# Patient Record
Sex: Female | Born: 1935 | Race: White | Hispanic: No
Health system: Southern US, Community
[De-identification: ages and names within clinical notes are randomized; demographics above are authoritative.]

---

## 1998-02-03 ENCOUNTER — Ambulatory Visit (HOSPITAL_COMMUNITY): Admission: RE | Admit: 1998-02-03 | Discharge: 1998-02-03 | Payer: Self-pay | Admitting: Family Medicine

## 1999-08-24 ENCOUNTER — Ambulatory Visit (HOSPITAL_COMMUNITY): Admission: RE | Admit: 1999-08-24 | Discharge: 1999-08-24 | Payer: Self-pay | Admitting: Family Medicine

## 2000-08-29 ENCOUNTER — Ambulatory Visit (HOSPITAL_COMMUNITY): Admission: RE | Admit: 2000-08-29 | Discharge: 2000-08-29 | Payer: Self-pay | Admitting: Family Medicine

## 2000-08-29 ENCOUNTER — Encounter: Payer: Self-pay | Admitting: Family Medicine

## 2002-10-31 ENCOUNTER — Ambulatory Visit (HOSPITAL_COMMUNITY): Admission: RE | Admit: 2002-10-31 | Discharge: 2002-10-31 | Payer: Self-pay | Admitting: *Deleted

## 2004-09-16 ENCOUNTER — Inpatient Hospital Stay: Payer: Self-pay | Admitting: Internal Medicine

## 2004-10-07 ENCOUNTER — Ambulatory Visit: Payer: Self-pay | Admitting: Internal Medicine

## 2004-10-15 ENCOUNTER — Ambulatory Visit: Payer: Self-pay

## 2005-01-12 ENCOUNTER — Ambulatory Visit: Payer: Self-pay | Admitting: Internal Medicine

## 2005-07-21 ENCOUNTER — Ambulatory Visit: Payer: Self-pay | Admitting: Internal Medicine

## 2006-01-19 ENCOUNTER — Ambulatory Visit: Payer: Self-pay | Admitting: Gastroenterology

## 2006-03-02 ENCOUNTER — Ambulatory Visit: Payer: Self-pay | Admitting: Gastroenterology

## 2006-11-04 ENCOUNTER — Ambulatory Visit: Payer: Self-pay | Admitting: Internal Medicine

## 2006-12-12 ENCOUNTER — Ambulatory Visit: Payer: Self-pay | Admitting: Vascular Surgery

## 2006-12-15 ENCOUNTER — Inpatient Hospital Stay: Payer: Self-pay | Admitting: Vascular Surgery

## 2007-03-07 ENCOUNTER — Ambulatory Visit: Payer: Self-pay | Admitting: Internal Medicine

## 2008-05-02 ENCOUNTER — Ambulatory Visit: Payer: Self-pay | Admitting: Gastroenterology

## 2008-05-29 ENCOUNTER — Ambulatory Visit: Payer: Self-pay | Admitting: Gastroenterology

## 2010-06-16 ENCOUNTER — Inpatient Hospital Stay: Payer: Self-pay | Admitting: Internal Medicine

## 2010-07-03 ENCOUNTER — Encounter: Payer: Self-pay | Admitting: Internal Medicine

## 2010-07-14 ENCOUNTER — Encounter: Payer: Self-pay | Admitting: Internal Medicine

## 2010-08-13 ENCOUNTER — Encounter: Payer: Self-pay | Admitting: Internal Medicine

## 2010-08-18 ENCOUNTER — Encounter: Payer: Self-pay | Admitting: Cardiology

## 2010-09-13 ENCOUNTER — Encounter: Payer: Self-pay | Admitting: Cardiology

## 2010-10-14 ENCOUNTER — Encounter: Payer: Self-pay | Admitting: Cardiology

## 2010-10-26 ENCOUNTER — Ambulatory Visit: Payer: Self-pay | Admitting: Internal Medicine

## 2010-11-12 ENCOUNTER — Ambulatory Visit: Payer: Self-pay | Admitting: Internal Medicine

## 2010-12-13 ENCOUNTER — Ambulatory Visit: Payer: Self-pay | Admitting: Internal Medicine

## 2011-01-01 ENCOUNTER — Ambulatory Visit: Payer: Self-pay

## 2012-04-28 ENCOUNTER — Inpatient Hospital Stay: Payer: Self-pay | Admitting: Specialist

## 2012-04-28 ENCOUNTER — Ambulatory Visit: Payer: Self-pay | Admitting: Unknown Physician Specialty

## 2013-05-28 ENCOUNTER — Ambulatory Visit: Payer: Self-pay | Admitting: Internal Medicine

## 2014-07-05 ENCOUNTER — Inpatient Hospital Stay: Payer: Self-pay | Admitting: Internal Medicine

## 2014-07-18 ENCOUNTER — Inpatient Hospital Stay: Payer: Self-pay | Admitting: Internal Medicine

## 2014-08-06 ENCOUNTER — Inpatient Hospital Stay: Payer: Self-pay | Admitting: Internal Medicine

## 2014-08-12 ENCOUNTER — Encounter: Payer: Self-pay | Admitting: Internal Medicine

## 2014-08-13 ENCOUNTER — Encounter: Payer: Self-pay | Admitting: Internal Medicine

## 2014-08-27 ENCOUNTER — Emergency Department: Payer: Self-pay | Admitting: Emergency Medicine

## 2014-09-13 ENCOUNTER — Encounter: Payer: Self-pay | Admitting: Internal Medicine

## 2014-10-21 ENCOUNTER — Ambulatory Visit: Payer: Self-pay | Admitting: Podiatry

## 2014-12-13 ENCOUNTER — Ambulatory Visit
Admit: 2014-12-13 | Disposition: A | Discharge status: Home / Self Care | Payer: Self-pay | Attending: Internal Medicine | Admitting: Internal Medicine

## 2014-12-23 ENCOUNTER — Inpatient Hospital Stay: Admit: 2014-12-23 | Disposition: E | Payer: Self-pay | Attending: Internal Medicine | Admitting: Internal Medicine

## 2014-12-31 NOTE — Consult Note (Signed)
PATIENT NAME:  Margaret Booth, PERREN MR#:  [id number] DATE OF BIRTH:  [birth year]  DATE OF CONSULTATION:  04/29/2012  REFERRING PHYSICIAN:  Ruthann Cancer, MD CONSULTING PHYSICIAN:  Starleen Arms, MD  PRIMARY CARE PHYSICIAN:  Einar Crow, MD  REASON FOR CONSULT:  Medical management and preoperative evaluation for left wrist surgical fracture repair.   HISTORY OF PRESENT ILLNESS: This is a 81 year old female who lives at home who had a mechanical fall as she tripped while playing with her dog and fell backward on her left wrist. She presented with laceration and was found to have open fracture of the left distal radius and ulna. She was admitted to the orthopedic service, Dr. Golda Acre service, who requested medical consult for medical management and preop evaluation. The patient denies any chest pain, shortness of breath, palpitations, altered mental status, loss of consciousness, or presyncope or syncope prior to fall and reported having tripped on her dog.  The patient's initial blood work in the ED showed her creatinine of 1.42, which is at her baseline, and she had mild leukocytosis at 12.1. The patient has a significant cardiac history where she had coronary artery disease and critical aortic valve stenosis where she had recent CABG and St. Jude Trifecta valve replacement at [facility] in October 2011.   PAST MEDICAL HISTORY:  1. History of coronary artery disease status post CABG with LIMA and distal LAD and aortic valve replacement with St. Jude Trifecta valve in October 2011.  2. Hypertension.  3. Hyperlipidemia.  4. History of transient ischemic attack in 2004. 5. Status post carotid endarterectomy.  6. History of chronic obstructive pulmonary disease.  7. History of diabetes mellitus.  8. History of congestive heart failure.   PAST SURGICAL HISTORY:  1. Status post tonsillectomy and adenoidectomy.  2. Status post appendectomy.  3. Status post bilateral tubal ligation.  4. Status post  resection of Morton's neuroma.  5. Status post hysterectomy.  6. Status post lumbar laminectomy.  7. Status post CABG and aortic valve replacement in 2011.   HOME MEDICATIONS:  1. Entocort 3 mg oral daily.  2. Vitamin D3 1000 units daily.  3. Simvastatin 80 mg at bedtime.  4. Glipizide 5 mg daily.  5. Lisinopril 20 mg daily.  6. Toprol-XL 25 mg daily.   ALLERGIES: No known drug allergies.   FAMILY HISTORY: Positive for coronary artery disease and brain tumor in family. Negative for breast, colon, or ovarian cancer.   SOCIAL HISTORY: The patient is a former smoker, 30+ pack-years.   REVIEW OF SYSTEMS: CONSTITUTIONAL: Denies any fever, fatigue, or weakness. EYES: Denies blurry vision, double vision, or pain. ENT: Denies tinnitus, ear pain, or hearing loss.  RESPIRATORY: Denies cough, wheezing, hemoptysis, or dyspnea.  Using oxygen at nighttime.  CARDIOVASCULAR: Denies chest pain, edema, arrhythmia, palpitations, syncope, or near syncope. GASTROINTESTINAL: Denies nausea, vomiting, diarrhea, or abdominal pain. GENITOURINARY: Denies dysuria, hematuria, or renal colic. ENDO: Denies polyuria, polydipsia, or heat or cold intolerance. HEMATOLOGIC: Denies anemia, easy bruising, or bleeding diathesis. INTEGUMENT: Denies any acne or rash. MUSCULOSKELETAL:  She had a fall with left wrist fracture. NEURO: Denies any numbness, epilepsy, tremors, or vertigo. PSYCH: Denies any insomnia, schizophrenia, or nervousness.   PHYSICAL EXAMINATION:  VITAL SIGNS: Temperature 96.3, pulse 66, respiratory rate 18, last blood pressure 228/88, saturating at 100% on room air.   GENERAL: Morbidly obese female, looks comfortable, in no apparent distress.   HEENT: Head atraumatic, normocephalic. Pupils are equal and reactive to light. Pink conjunctivae. Anicteric  sclerae. Moist oral mucosa.   NECK: Supple. No thyromegaly. No JVD.   CHEST: Good air entry bilaterally. No wheezing, rales, or rhonchi.   CARDIOVASCULAR:  S1, S2 heard. No rubs or gallops. Had a grade 1 systolic murmur.   ABDOMEN: Obese, soft, nontender, nondistended. Bowel sounds present.   EXTREMITIES: Lower extremities no edema, no clubbing, no cyanosis.  Has left upper extremity wrapped around the wrist area.   PSYCHIATRIC: Appropriate affect. Awake, alert, oriented times three. Intact judgment and insight.   NEURO:  Cranial nerves grossly intact. Motor five out of five, decreased left upper extremity secondary to pain.   SKIN: No rash. Normal skin turgor. Warm and dry.   PERTINENT LABORATORY DATA:  Glucose 198, BUN 28, creatinine 1.42, sodium 141, potassium 4.3, chloride 105, CO2 27. White blood cells 12.1, hemoglobin 13.7, hematocrit 40.7,  platelets 178, INR 0.8.   ASSESSMENT AND PLAN:  1. This is a 81 year old female status post mechanical fall who presents with left radius and ulnar fractures, being planned for surgical intervention in the a.m. The patient has no chest pain, shortness of breath, or palpitations, but her blood pressure is uncontrolled most likely due to pain. We will resume the patient on her home hypertension medications Toprol-XL and lisinopril. As well we will add IV p.r.n. hydralazine. The patient was started on IV antibiotics by the orthopedic service.  2. Hypertension, uncontrolled most likely due to pain. We will resume Toprol-XL and lisinopril. We will give first doses now and we will have her on p.r.n. hydralazine.  3. Hyperlipidemia. We will resume statin.  4. Diabetes mellitus. We will hold oral hyperglycemic agent and keep on insulin sliding scale.  5. We will have the patient on sequential compression devices for deep vein thrombosis prophylaxis currently and will leave chemical anticoagulation for primary orthopedic team after surgery.    TOTAL TIME SPENT ON PATIENT CARE: 50 minutes.     ____________________________ Starleen Armsawood S. Chezney Huether, MD dse:bjt D: 04/29/2012 02:25:56 ET T: 04/29/2012 09:31:24  ET JOB#: [id number]  cc: Starleen Armsawood S. Calyb Mcquarrie, MD, <Dictator> Marya AmslerMarshall W. Dareen PianoAnderson, MD Jerrika Ledlow Teena IraniS Nalee Lightle MD ELECTRONICALLY SIGNED 04/30/2012 0:17

## 2014-12-31 NOTE — Op Note (Signed)
PATIENT NAME:  Margaret Booth, Margaret Booth DATE OF BIRTH:  [birth year]  DATE OF PROCEDURE:  04/29/2012  PREOPERATIVE DIAGNOSIS: Grade 1 open Colles' fracture, left wrist.   POSTOPERATIVE DIAGNOSIS:  Grade 1 open Colles' fracture, left wrist.   PROCEDURE:  Open reduction, internal fixation of left distal radius, debridement of  tissue minimal.   SURGEON: Winn JockJames C. Beyounce Dickens, M.D.   ASSISTANT: None.   ANESTHESIA: General.   ESTIMATED BLOOD LOSS: Minimal.   COMPLICATIONS: None.   IMPLANTS USED: [employer] volar plate.   BRIEF CLINICAL NOTE AND PATHOLOGY: The patient fell and suffered the above-mentioned fracture. She had several skin abrasions and one small laceration. She was admitted, worked up medically, and the patient was taken to the operating room. She had a very small area of skin penetration which seemed to be more of an actual skin tear. The patient's bone was very osteoporotic. Her skin was extremely friable.   DESCRIPTION OF PROCEDURE: Preop antibiotics, adequate anesthesia, supine position, Betadine scrub followed by prep. The area of the skin tears was thoroughly evaluated.  The open area was debrided and thoroughly irrigated. This was done with a scalpel and was primarily soft tissue.   The fracture was then exposed through a volar incision, through the flexor carpi radialis sheath. Neurovascular structures were protected, the interval developed down to the radius with the pronator being reflected. The fracture was examined under direct visualization, thoroughly irrigated, and reduced under direct visualization. AP and lateral views showed satisfactory reduction. The plate was then applied in routine fashion. Screws had excellent purchase and reduction was very good. The AP, lateral, and oblique views showed good positioning. Multiple fragments were captured. The area was thoroughly irrigated. Hemostasis was good. The tourniquet was not used. The wound was closed with  0 Vicryl and with nylon. Soft sterile dressing was applied and Xeroform was placed over the areas of skin abrasion. The patient was placed in a short arm splint. She was awakened and taken to the postanesthesia care unit having tolerated the procedure well. Sponge and needle counts were correct prior to and after wound closure.      ____________________________ Winn JockJames C. Gerrit Heckaliff, MD jcc:bjt D: 05/11/2012 11:01:28 ET T: 05/11/2012 11:27:01 ET JOB#: [id number]  cc: Winn JockJames C. Gerrit Heckaliff, MD, <Dictator> Winn JockJAMES C Simon Aaberg MD ELECTRONICALLY SIGNED 05/13/2012 20:22

## 2014-12-31 NOTE — H&P (Signed)
Subjective/Chief Complaint Left wrist injury    History of Present Illness Lost balance and fell backwars, landed on left wrist.Was seen in ER.  Skin tear with duistal radius and ulna fracture. No LOC   Past Med/Surgical Hx:  Hypercholesterolemia:   HTN:   Diabetes:   CABG (Coronary Artery Bypass Graft):   aortic valve replacement:   ALLERGIES:  No Known Allergies:   HOME MEDICATIONS: Medication Instructions Status  Vicodin tablet 500 mg-5 mg 1 tab(s) orally every 6 hours x 5 days PRN   Active  Advil 1 tab(s)   PRN   Active  simvastatin 80 mg oral tablet 1 tab(s) orally once a day (at bedtime)  Active  metoprolol 25 mg oral tablet 1 tab(s) orally once a day (in the morning)  Active  calcium-vitamin D 1 tab(s)  once a day (in the morning)  Active  lisinopril 10 mg oral tablet 1 tab(s) orally once a day (in the morning)  Active  [facility] Aspirin 81 mg oral tablet, chewable 1 tab(s) orally once a day (in the morning)  Active  Entocort EC 3 mg oral capsule, extended release 1  orally 3 times a day  Active  Januvia 50 mg oral tablet 1  orally once a day  Active  glipiZIDE 5 mg oral tablet 1  orally once a day  Active   Family and Social History:   Family History Non-Contributory    Social History negative tobacco, negative ETOH, negative Illicit drugs    Place of Living Home   Review of Systems:   Fever/Chills No    Cough No    Sputum No    Abdominal Pain No    Diarrhea No    Constipation No    Nausea/Vomiting No    SOB/DOE No    Chest Pain No    Dysuria No    Tolerating Diet Yes   Physical Exam:   GEN well developed    HEENT PERRL    NECK supple    RESP clear BS    CARD regular rate    ABD denies tenderness  soft  normal BS    LYMPH negative neck, negative axillae    EXTR Left wrist deformed. Pain with any motion. Several skin tears    SKIN normal to palpation, Except left wrist    NEURO motor/sensory function intact, except left wrist,  difficult to evaluate    PSYCH A+O to time, place, person   Radiology Results: XRay:    16-Aug-13 22:12, Wrist Left Complete   Wrist Left Complete   REASON FOR EXAM:    fall  COMMENTS:   LMP: Post-Menopausal    PROCEDURE: DXR - DXR WRIST LT COMP WITH OBLIQUES  - Apr 28 2012 10:12PM     RESULT: Four views of the left wrist are submitted. The patient has   sustained an acute displaced fracture of the distal left radial   metaphysis. There is a fracture through the ulnar styloid as well. There   is degenerative change of the first carpometacarpal joint. No acute   carpal bone fracture is demonstrated. The metacarpals appear intact.    IMPRESSION:  The patient has sustained an acute displaced fracture of the   distal left radial metaphysis. An ulnar styloid fracture is present as   well.     Dictation Site: 5          Verified By: DAVID A. [place], M.D., MD  CT:    16-Aug-13  22:28, CT Cervical Spine Without Contrast   CT Cervical Spine Without Contrast   REASON FOR EXAM:    fall  COMMENTS:   LMP: Post Hysterectomy    PROCEDURE: CT  - CT CERVICAL SPINE WO  - Apr 28 2012 10:28PM     RESULT: Sagittal, axial, and coronal images through the cervical spine   are reviewed.     The cervical vertebral bodies are preserved in height. The prevertebral   soft tissue spaces appear normal. There is disc space narrowing at the   C5-C6 and C6-C7 disc levels. There is no evidence of a perched facet. The   spinous processes are intact. There is moderate facet joint degenerative   change at multiple levels. The lateral masses of C1 align normally with   those of C2. The odontoid is intact. The bony ring at each cervical level   is intact. The limited amount of the pulmonary apices visible reveals no     pneumothorax.    IMPRESSION:   1. There is no evidence of an acute cervical spine fracture nor   dislocation.  2. There is mild degenerative disc disease of the mid and lower cervical    spine. There is mild facet joint degenerative change at multiple levels.    Dictation Site: 5          Verified By: DAVID A. SwazilandJORDAN, M.D., MD    16-Aug-13 22:28, CT Head Without Contrast   CT Head Without Contrast   REASON FOR EXAM:    fall  COMMENTS:   LMP: Post-Menopausal    PROCEDURE: CT  - CT HEAD WITHOUT CONTRAST  - Apr 28 2012 10:28PM     RESULT: Axial noncontrast CT scanning was performed through the brain   with reconstructions at 5 mm intervals and slice thicknesses. There are   no previous studies for comparison.    There is mild diffuse cerebral and cerebellar atrophy. There is an old   lacunar infarction in the left basal ganglia. There is decreased density   in the deep white matter of the left frontal lobe adjacent to the corpus   callosum consistent with chronic small vessel ischemic type change. There   is no evidence of an acute intracranial hemorrhage. At bone window   settings the observed portions of the paranasal sinuses and mastoid air     cells are clear. There is no evidence of an acute skull fracture.    IMPRESSION:  There is no evidence of an acute ischemic or hemorrhagic   infarction. There are chronic changes present. There is no evidence of a   subdural or subarachnoid hemorrhage.     Dictation Site: 5          Verified By: DAVID A. SwazilandJORDAN, M.D., MD     Assessment/Admission Diagnosis 1. Grade 1 open fracture left distal radius and ulna 2 Significant cardiac history 3 Significant medical history-including diabetes All labs, EKG pending    Plan IV antibiotics ( she had not recieved any prior to my seeing her) Medic al evaluation ORIF tomorrow  Risks and benefits discussed   Electronic Signatures: Celesta Gentilealiff, Amarachi Kotz C (MD)  (Signed 16-Aug-13 23:37)  Authored: CHIEF COMPLAINT and HISTORY, PAST MEDICAL/SURGIAL HISTORY, ALLERGIES, HOME MEDICATIONS, FAMILY AND SOCIAL HISTORY, REVIEW OF SYSTEMS, PHYSICAL EXAM, Radiology, ASSESSMENT AND  PLAN   Last Updated: 16-Aug-13 23:37 by Celesta Gentilealiff, Sheldon Amara C (MD)

## 2014-12-31 NOTE — Discharge Summary (Signed)
PATIENT NAME:  Margaret Booth, Margaret Booth MR#:  [id number] DATE OF BIRTH:  [birth year]  DATE OF ADMISSION:  04/28/2012 DATE OF DISCHARGE:  0Gordy Savers8/20/2013   DISCHARGE DIAGNOSES:  1. Open distal left radius fracture.  2. Congestive heart failure.  3. Hypertension.  4. Hyperlipidemia.  5. History of chronic obstructive pulmonary disease.  6. Diabetes mellitus.   OPERATIONS/PROCEDURES PERFORMED: Irrigation, debridement, and open reduction internal fixation distal left radius fracture by Dr. Ruthann CancerJames Califf.   PHYSICAL EXAMINATION: As dictated on admission by Dr. Gerrit Heckaliff.   LABORATORY DATA: As noted in the chart.   COURSE IN HOSPITAL: The patient was admitted by Dr. Gerrit Heckaliff through the Emergency Room. Preoperative medical consultation was obtained with the medical hospitalist. His note is as noted on the chart. After medical clearance the patient was taken to the operating room where debridement and open reduction internal fixation of the distal radius fracture was performed. Postoperatively the patient did have a moderate amount of confusion and agitation and was treated medically as per the medical hospitalist. She was advanced up into the chair and in physical therapy to ambulation using the walker. Her mental status cleared and by 05/02/2012 it was felt that she could be transferred to the rehab facility. Her orders are as on the printed order sheet.   DISCHARGE INSTRUCTIONS:  1. Please keep her left arm splint clean and dry and intact.  2. She is to put no weight bearing on the splint.      3. She is to return to see Dr. Katrinka BlazingSmith in the office in one week for wound examination and x-ray.   ____________________________ Clare Gandyhristopher E. Laymond Postle, MD ces:drc D: 05/02/2012 10:01:58 ET T: 05/02/2012 10:21:31 ET JOB#: [id number]  cc: Clare Gandyhristopher E. Aster Screws, MD, <Dictator> Marya AmslerMarshall W. Dareen PianoAnderson, MD Clare GandyHRISTOPHER E Mathew Postiglione MD ELECTRONICALLY SIGNED 05/04/2012 9:51

## 2015-01-12 DEATH — deceased

## 2015-01-27 IMAGING — CT CT CERVICAL SPINE WITHOUT CONTRAST
3 of 6 series · 10 of 33 positions shown, 12 images · non-contrast
Comparison: 04/28/2012

CLINICAL DATA: Pt was getting up to use the restroom this AM, slid
and fell down to floor, bumping her head, denies LOC, no bleeding of
note, dime size hematoma noted to occipital head. Neck pain.

EXAM:
CT HEAD WITHOUT CONTRAST
CT CERVICAL SPINE WITHOUT CONTRAST
TECHNIQUE: Multidetector CT imaging of the head and cervical spine was
performed following the standard protocol without intravenous
contrast. Multiplanar CT image reconstructions of the cervical spine
were also generated.

[Series 10: sag bone · sagittal · 0.21mm/px · 5 of 48 slices shown, 6 images]
[im 16/48  bone]
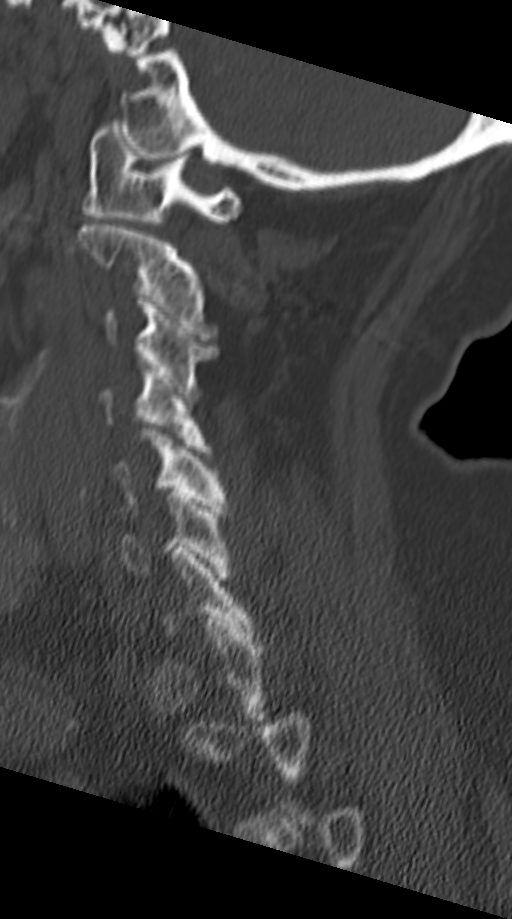
[im 20/48  bone]
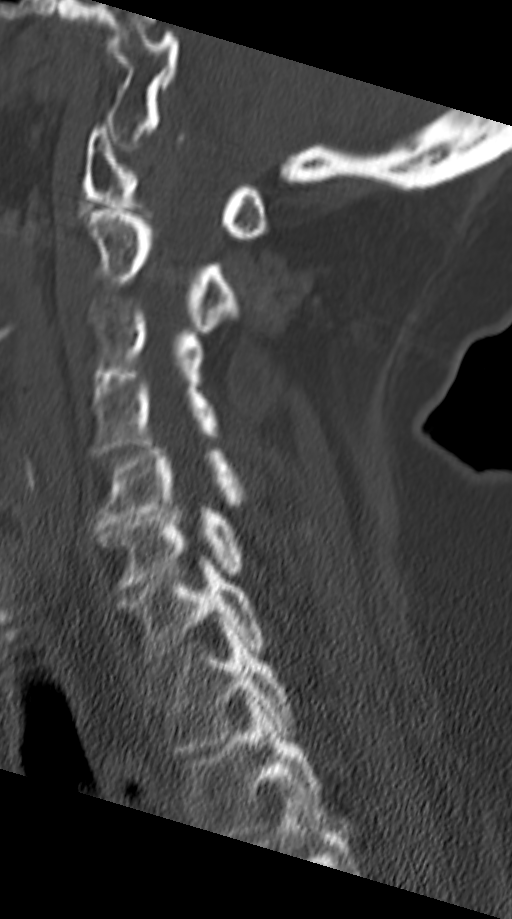
[im 24/48  soft-tissue]
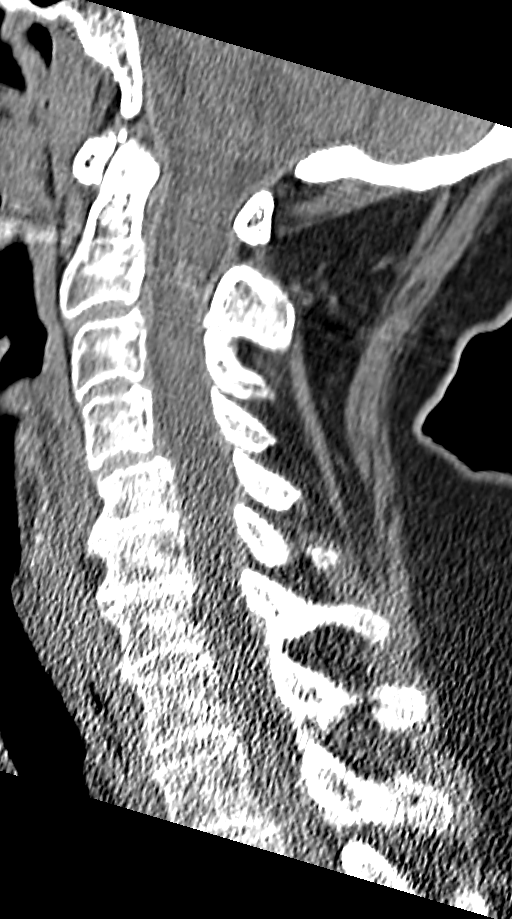
[im 24/48  bone]
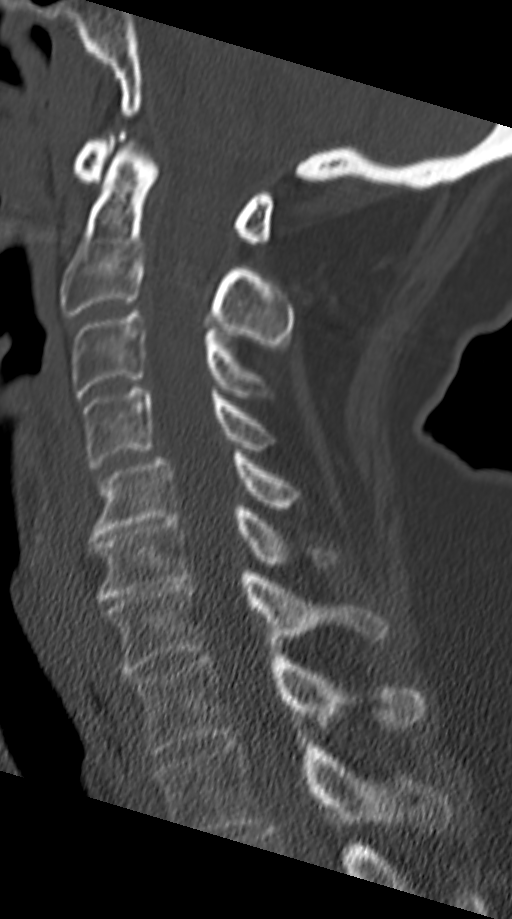
[im 28/48  bone]
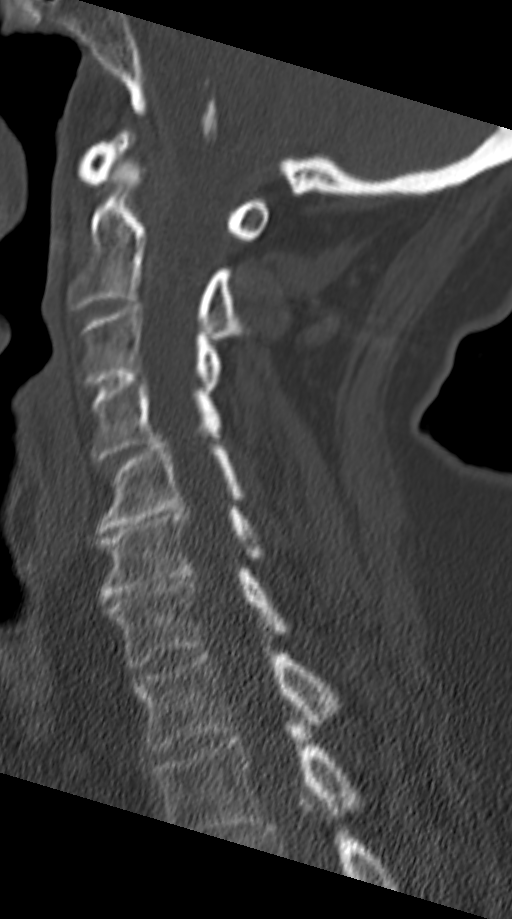
[im 32/48  bone]
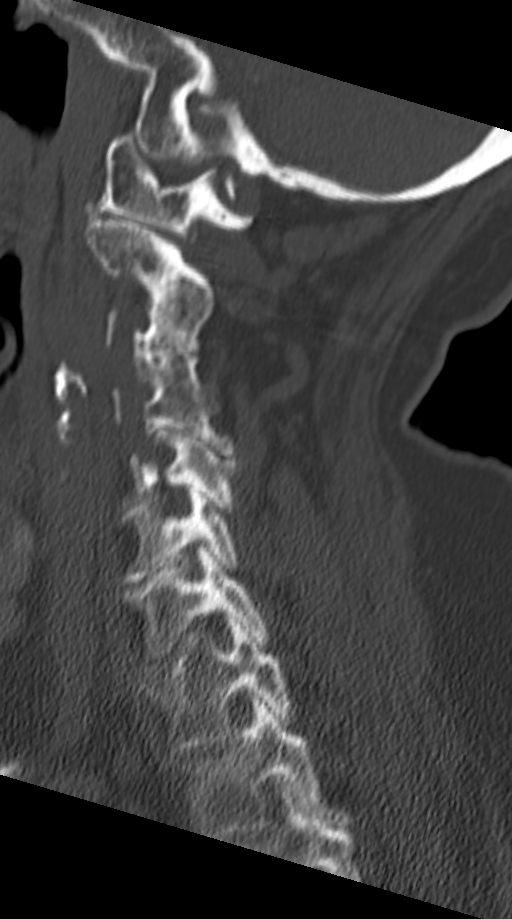

[Series 11: cor bone · coronal · 0.25mm/px · 3 of 39 slices shown]
[im 8/39  bone]
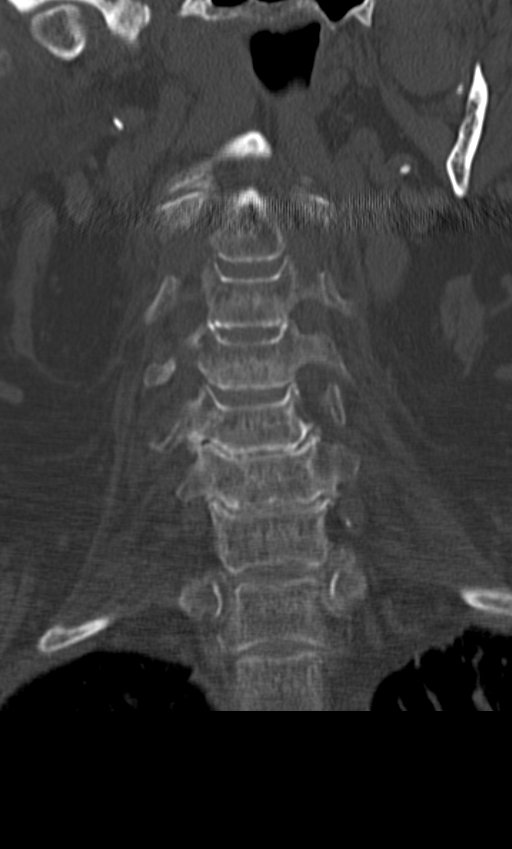
[im 16/39  bone]
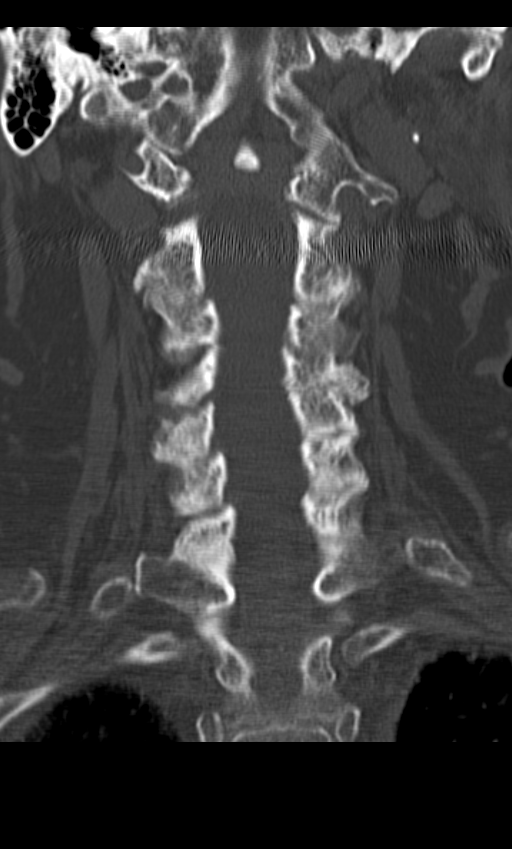
[im 23/39  bone]
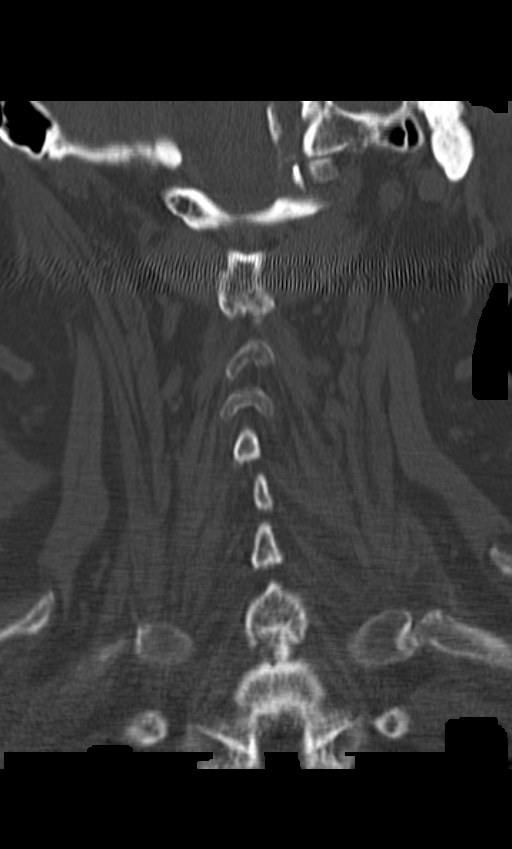

[Series 12: orthogonal axials · axial · 0.29mm/px · z∈[+361,+432]mm · 2 of 102 slices shown, 3 images]
[im 41/102  soft-tissue]
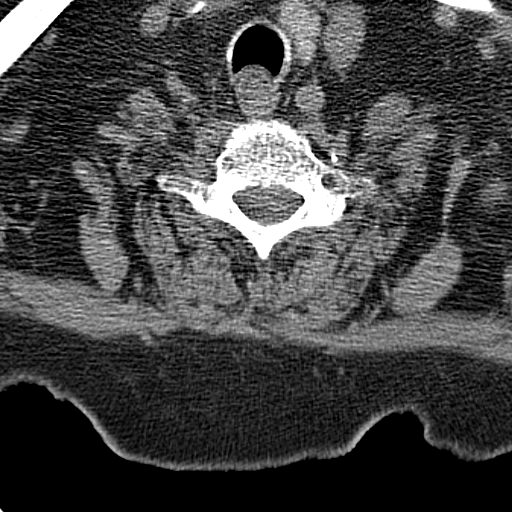
[im 41/102  bone]
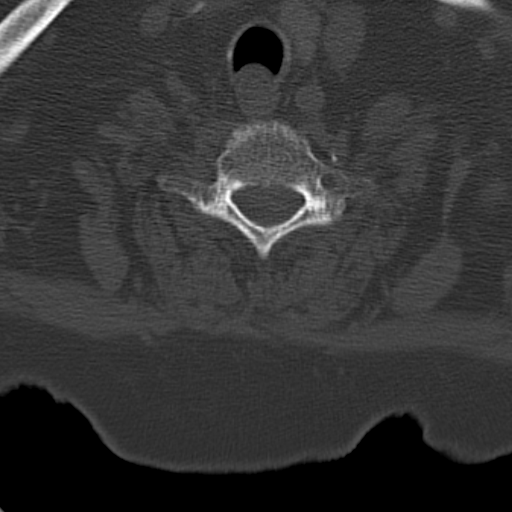
[im 81/102  bone]
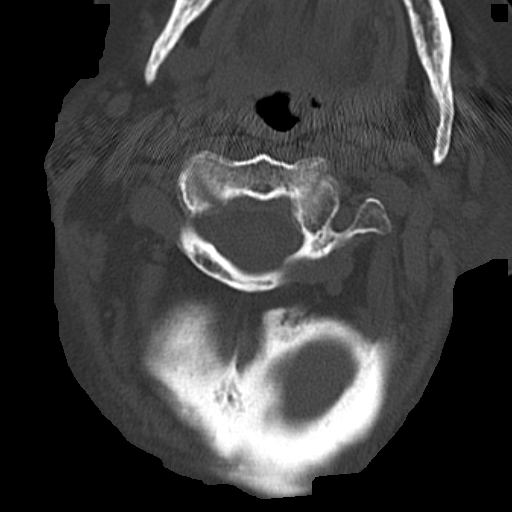

[10 of 33 positions shown; findings below may reference images not displayed]

FINDINGS: CT HEAD FINDINGS

There is no evidence of mass effect, midline shift, or extra-axial
fluid collections. There is no evidence of a space-occupying lesion
or intracranial hemorrhage. There is no evidence of a cortical-based
area of acute infarction. There are old left basal ganglial lacunar
infarct. There is generalized cerebral atrophy. There is
periventricular white matter low attenuation likely secondary to
microangiopathy.

The ventricles and sulci are appropriate for the patient's age. The
basal cisterns are patent.

Visualized portions of the orbits are unremarkable. The visualized
portions of the paranasal sinuses and mastoid air cells are
unremarkable. Cerebrovascular atherosclerotic calcifications are
noted.

The osseous structures are unremarkable.

CT CERVICAL SPINE FINDINGS

The alignment is anatomic. The vertebral body heights are
maintained. There is no acute fracture. There is no static
listhesis. The prevertebral soft tissues are normal. The intraspinal
soft tissues are not fully imaged on this examination due to poor
soft tissue contrast, but there is no gross soft tissue abnormality.

There is degenerative disc disease with disc height loss at C5-6 and
C6-7. There is moderate bilateral facet arthropathy at C2-3. There
is moderate left facet arthropathy and uncovertebral degenerative
changes C3-4 with foraminal encroachment. There is bilateral facet
arthropathy and left uncovertebral degenerative change resulting in
left foraminal encroachment. There is bilateral facet arthropathy
and uncovertebral degenerative changes at C5-6 with bilateral
foraminal encroachment. There is bilateral uncovertebral
degenerative changes C6-7 with bilateral foraminal encroachment.

The visualized portions of the lung apices demonstrate no focal
abnormality.

There is bilateral carotid artery atherosclerosis.
IMPRESSION: 1. No acute intracranial pathology.
2. No acute osseous injury of the cervical spine.
3. Cervical spine spondylosis as described above.
4. Chronic microvascular disease and cerebral atrophy. Old left
basal ganglia lacunar infarcts.

## 2020-04-13 DEATH — deceased
